# Patient Record
Sex: Female | Born: 1995 | Race: Black or African American | Hispanic: No | Marital: Single | Smoking: Never smoker
Health system: Southern US, Community
[De-identification: ages and names within clinical notes are randomized; demographics above are authoritative.]

## PROBLEM LIST (undated history)

## (undated) ENCOUNTER — Emergency Department (HOSPITAL_COMMUNITY): Payer: BC Managed Care – PPO

## (undated) DIAGNOSIS — D649 Anemia, unspecified: Secondary | ICD-10-CM

## (undated) HISTORY — PX: NO PAST SURGERIES: SHX2092

---
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
## 2014-03-10 ENCOUNTER — Encounter (HOSPITAL_COMMUNITY): Payer: Self-pay | Admitting: Emergency Medicine

## 2014-03-10 DIAGNOSIS — D649 Anemia, unspecified: Secondary | ICD-10-CM | POA: Insufficient documentation

## 2014-03-10 DIAGNOSIS — R102 Pelvic and perineal pain: Secondary | ICD-10-CM | POA: Insufficient documentation

## 2014-03-10 DIAGNOSIS — Z3202 Encounter for pregnancy test, result negative: Secondary | ICD-10-CM | POA: Insufficient documentation

## 2014-03-10 DIAGNOSIS — Z79899 Other long term (current) drug therapy: Secondary | ICD-10-CM | POA: Insufficient documentation

## 2014-03-10 LAB — CBC WITH DIFFERENTIAL/PLATELET
BASOS PCT: 0 % (ref 0–1)
Basophils Absolute: 0 10*3/uL (ref 0.0–0.1)
EOS ABS: 0.1 10*3/uL (ref 0.0–0.7)
EOS PCT: 2 % (ref 0–5)
HEMATOCRIT: 34.6 % — AB (ref 36.0–46.0)
HEMOGLOBIN: 11.4 g/dL — AB (ref 12.0–15.0)
Lymphocytes Relative: 19 % (ref 12–46)
Lymphs Abs: 1.5 10*3/uL (ref 0.7–4.0)
MCH: 26.8 pg (ref 26.0–34.0)
MCHC: 32.9 g/dL (ref 30.0–36.0)
MCV: 81.4 fL (ref 78.0–100.0)
MONO ABS: 0.4 10*3/uL (ref 0.1–1.0)
MONOS PCT: 5 % (ref 3–12)
NEUTROS ABS: 5.9 10*3/uL (ref 1.7–7.7)
Neutrophils Relative %: 74 % (ref 43–77)
Platelets: 328 10*3/uL (ref 150–400)
RBC: 4.25 MIL/uL (ref 3.87–5.11)
RDW: 18.7 % — ABNORMAL HIGH (ref 11.5–15.5)
WBC: 8 10*3/uL (ref 4.0–10.5)

## 2014-03-10 LAB — COMPREHENSIVE METABOLIC PANEL
ALBUMIN: 3.9 g/dL (ref 3.5–5.2)
ALT: 10 U/L (ref 0–35)
ANION GAP: 15 (ref 5–15)
AST: 16 U/L (ref 0–37)
Alkaline Phosphatase: 84 U/L (ref 39–117)
BILIRUBIN TOTAL: 0.5 mg/dL (ref 0.3–1.2)
BUN: 9 mg/dL (ref 6–23)
CHLORIDE: 100 meq/L (ref 96–112)
CO2: 23 mEq/L (ref 19–32)
CREATININE: 0.76 mg/dL (ref 0.50–1.10)
Calcium: 9.2 mg/dL (ref 8.4–10.5)
GFR calc Af Amer: >90 mL/min (ref >90)
GFR calc non Af Amer: >90 mL/min (ref >90)
Glucose, Bld: 126 mg/dL — ABNORMAL HIGH (ref 70–99)
Potassium: 3.6 mEq/L — ABNORMAL LOW (ref 3.7–5.3)
Sodium: 138 mEq/L (ref 137–147)
Total Protein: 7.8 g/dL (ref 6.0–8.3)

## 2014-03-10 NOTE — ED Notes (Signed)
Pt. reports LLQ pain with mild nausea onset 5 days ago , denies urinary discomfort , no emesis or diarrhea . Respirations unlabored / denies fever or chills. Pt. is on her 5th day of menstrual cycle.

## 2014-03-11 ENCOUNTER — Emergency Department (HOSPITAL_COMMUNITY)
Admission: EM | Admit: 2014-03-11 | Discharge: 2014-03-11 | Disposition: A | Discharge status: Home / Self Care | Payer: Self-pay | Attending: Emergency Medicine | Admitting: Emergency Medicine

## 2014-03-11 ENCOUNTER — Emergency Department (HOSPITAL_COMMUNITY): Payer: Self-pay

## 2014-03-11 DIAGNOSIS — R102 Pelvic and perineal pain: Secondary | ICD-10-CM

## 2014-03-11 DIAGNOSIS — R1032 Left lower quadrant pain: Secondary | ICD-10-CM

## 2014-03-11 LAB — URINALYSIS, ROUTINE W REFLEX MICROSCOPIC
BILIRUBIN URINE: NEGATIVE
Glucose, UA: NEGATIVE mg/dL
KETONES UR: NEGATIVE mg/dL
Leukocytes, UA: NEGATIVE
NITRITE: NEGATIVE
Protein, ur: NEGATIVE mg/dL
SPECIFIC GRAVITY, URINE: 1.019 (ref 1.005–1.030)
UROBILINOGEN UA: 0.2 mg/dL (ref 0.0–1.0)
pH: 7.5 (ref 5.0–8.0)

## 2014-03-11 LAB — WET PREP, GENITAL
Trich, Wet Prep: NONE SEEN
Yeast Wet Prep HPF POC: NONE SEEN

## 2014-03-11 LAB — HIV ANTIBODY (ROUTINE TESTING W REFLEX): HIV 1&2 Ab, 4th Generation: NONREACTIVE

## 2014-03-11 LAB — URINE MICROSCOPIC-ADD ON

## 2014-03-11 LAB — RPR

## 2014-03-11 LAB — PREGNANCY, URINE: PREG TEST UR: NEGATIVE

## 2014-03-11 MED ORDER — NAPROXEN SODIUM 220 MG PO TABS: 220.0000 mg | ORAL_TABLET | Freq: Two times a day (BID) | ORAL | Status: AC

## 2014-03-11 MED ORDER — OXYCODONE-ACETAMINOPHEN 5-325 MG PO TABS
2.0000 | ORAL_TABLET | Freq: Once | ORAL | Status: AC
  Administered 2014-03-11: 2 via ORAL
  Filled 2014-03-11: qty 2

## 2014-03-11 MED ORDER — OXYCODONE-ACETAMINOPHEN 5-325 MG PO TABS
1.0000 | ORAL_TABLET | Freq: Four times a day (QID) | ORAL | Status: AC | PRN
Start: 2014-03-11 — End: ?

## 2014-03-11 NOTE — ED Notes (Signed)
Pelvic cart set up at bedside  

## 2014-03-11 NOTE — ED Notes (Signed)
Returned from [facility]

## 2014-03-11 NOTE — ED Provider Notes (Signed)
CSN: [id number]     Arrival date & time 03/10/14  2107 History   First MD Initiated Contact with Patient 03/11/14 0049     Chief Complaint  Patient presents with  . Abdominal Pain     (Consider location/radiation/quality/duration/timing/severity/associated sxs/prior Treatment) HPI 18 yo female presents to the ER from home with complaint of LLQ pain ongoing for the last 5 days, coinciding with onset of her period.  Pt has h/o painful menses, but reports not usually this bad.  Pt had depo in the past which did not help with symptoms, and has been advised to start OCP.  She has not f/u with gyn as suggested.  No fevers, chills, n/v/d.  No vaginal discharge, no urinary symptoms.  Pt also c/o shortness of breath upon waking this morning.  No cough, wheeze or URI type symptoms.  Pt is nonsmoker.  No h/o asthma.   Past Medical History  Diagnosis Date  . Anemia   . Anemia    Past Surgical History  Procedure Laterality Date  . No past surgeries     Family History  Problem Relation Age of Onset  . Arthritis Maternal Grandmother   . Cancer Maternal Grandmother   . Cancer Maternal Grandfather     lung, spine, and breast   History  Substance Use Topics  . Smoking status: Never Smoker   . Smokeless tobacco: Not on file  . Alcohol Use: No   OB History   Grav Para Term Preterm Abortions TAB SAB Ect Mult Living   0              Review of Systems  All other systems reviewed and are negative.     Allergies  Shrimp  Home Medications   Prior to Admission medications   Medication Sig Start Date End Date Taking? Authorizing Provider  albuterol (PROVENTIL HFA;VENTOLIN HFA) 108 (90 BASE) MCG/ACT inhaler Inhale 1-2 puffs into the lungs every 6 (six) hours as needed for wheezing. 01/03/13  Yes Heather Laisure, PA-C  IRON PO Take by mouth.   Yes Historical Provider, MD  naproxen sodium (ANAPROX) 220 MG tablet Take 220 mg by mouth 2 (two) times daily with a meal.   Yes Historical Provider, MD   ferrous sulfate 325 (65 FE) MG tablet Take 1 tablet (325 mg total) by mouth daily. 08/12/11 08/11/12  Cyndra Numbers, MD   BP 117/63  Pulse 82  Temp(Src) 99.1 F (37.3 C) (Oral)  Resp 23  Ht 5\' 3"  (1.6 m)  Wt 142 lb (64.411 kg)  BMI 25.16 kg/m2  SpO2 94%  LMP 03/06/2014 Physical Exam  Nursing note and vitals reviewed. Constitutional: She is oriented to person, place, and time. She appears well-developed and well-nourished.  HENT:  Head: Normocephalic and atraumatic.  Nose: Nose normal.  Mouth/Throat: Oropharynx is clear and moist.  Eyes: Conjunctivae and EOM are normal. Pupils are equal, round, and reactive to light.  Neck: Normal range of motion. Neck supple. No JVD present. No tracheal deviation present. No thyromegaly present.  Cardiovascular: Normal rate, regular rhythm, normal heart sounds and intact distal pulses.  Exam reveals no gallop and no friction rub.   No murmur heard. Pulmonary/Chest: Effort normal and breath sounds normal. No stridor. No respiratory distress. She has no wheezes. She has no rales. She exhibits no tenderness.  Abdominal: Soft. Bowel sounds are normal. She exhibits no distension and no mass. There is tenderness (ttp over lower abdomen worse in LLQ). There is no rebound and no  guarding.  Genitourinary:  External genitalia normal Vagina with small amount of bloody discharge Cervix  normal negative for cervical motion tenderness Adnexa palpated, no masses but positive for tenderness on left noted Bladder palpated negative for tenderness Uterus palpated no masses or positive for tenderness    Musculoskeletal: Normal range of motion. She exhibits no edema and no tenderness.  Lymphadenopathy:    She has no cervical adenopathy.  Neurological: She is alert and oriented to person, place, and time. She displays normal reflexes. She exhibits normal muscle tone. Coordination normal.  Skin: Skin is warm and dry. No rash noted. No erythema. No pallor.   Psychiatric: She has a normal mood and affect. Her behavior is normal. Judgment and thought content normal.    ED Course  Procedures (including critical care time) Labs Review Labs Reviewed  WET PREP, GENITAL - Abnormal; Notable for the following:    Clue Cells Wet Prep HPF POC FEW (*)    WBC, Wet Prep HPF POC FEW (*)    All other components within normal limits  CBC WITH DIFFERENTIAL - Abnormal; Notable for the following:    Hemoglobin 11.4 (*)    HCT 34.6 (*)    RDW 18.7 (*)    All other components within normal limits  COMPREHENSIVE METABOLIC PANEL - Abnormal; Notable for the following:    Potassium 3.6 (*)    Glucose, Bld 126 (*)    All other components within normal limits  URINALYSIS, ROUTINE W REFLEX MICROSCOPIC - Abnormal; Notable for the following:    Hgb urine dipstick MODERATE (*)    All other components within normal limits  GC/CHLAMYDIA PROBE AMP  PREGNANCY, URINE  URINE MICROSCOPIC-ADD ON  RPR  HIV ANTIBODY (ROUTINE TESTING)    Imaging Review [place] Transvaginal Non-ob  03/11/2014   CLINICAL DATA:  Initial evaluation for acute left lower quadrant pain  EXAM: ULTRASOUND PELVIS TRANSVAGINAL  TECHNIQUE: Transvaginal ultrasound examination of the pelvis was performed including evaluation of the uterus, ovaries, adnexal regions, and pelvic cul-de-sac.  COMPARISON:  Prior ultrasound from 08/14/2011.  FINDINGS: Uterus  Measurements: 7.1 x 4.2 x 4.8 cm. No fibroids or other mass visualized.  Endometrium  Thickness: 4.5 mm.  No focal abnormality visualized.  Right ovary  Measurements: 4.1 x 3.0 x 2.9 cm. Normal appearance. A 1.2 x 1.4 x 1.3 cm cyst within the right ovary noted with internal debris. No internal vascularity. Likely physiologic cyst.  Left ovary  Measurements: 3.6 x 1.9 x 2.2 cm. Normal appearance/no adnexal mass.  Other findings: Trace free fluid present within the pelvis. Likely physiologic.  IMPRESSION: 1. No sonographic evidence of acute abnormality within the  pelvis. 2. 1.2 x 1.4 x 1.3 cm right ovarian cyst with internal debris, likely a physiologic cyst. 3. Normal left ovary. 4. Trace free fluid within the pelvis, likely physiologic.   Electronically Signed   By: Rise MuBenjamin  McClintock M.D.   On: 03/11/2014 06:25   [place] Pelvis Complete  03/11/2014   CLINICAL DATA:  Initial evaluation for acute left lower quadrant pain  EXAM: ULTRASOUND PELVIS TRANSVAGINAL  TECHNIQUE: Transvaginal ultrasound examination of the pelvis was performed including evaluation of the uterus, ovaries, adnexal regions, and pelvic cul-de-sac.  COMPARISON:  Prior ultrasound from 08/14/2011.  FINDINGS: Uterus  Measurements: 7.1 x 4.2 x 4.8 cm. No fibroids or other mass visualized.  Endometrium  Thickness: 4.5 mm.  No focal abnormality visualized.  Right ovary  Measurements: 4.1 x 3.0 x 2.9 cm. Normal appearance. A 1.2 x  1.4 x 1.3 cm cyst within the right ovary noted with internal debris. No internal vascularity. Likely physiologic cyst.  Left ovary  Measurements: 3.6 x 1.9 x 2.2 cm. Normal appearance/no adnexal mass.  Other findings: Trace free fluid present within the pelvis. Likely physiologic.  IMPRESSION: 1. No sonographic evidence of acute abnormality within the pelvis. 2. 1.2 x 1.4 x 1.3 cm right ovarian cyst with internal debris, likely a physiologic cyst. 3. Normal left ovary. 4. Trace free fluid within the pelvis, likely physiologic.   Electronically Signed   By: Rise MuBenjamin  McClintock M.D.   On: 03/11/2014 06:25     EKG Interpretation None      MDM   Final diagnoses:  LLQ pain    18 yo female with acute on chronic pelvic pain.  Pain with each cycle, has irregular cycles.  Pain currently in LLQ, worse than usual.  Also with some sob, no cough, or wheezing.  No lung disease.  No urinary symptoms, no n/v/d,no f/c.  U/s unremarkable, pelvic unremarkable.  Will have her f/u with gyn.    Olivia Mackielga M Legaci Tarman, MD 03/12/14 [phone]

## 2014-03-11 NOTE — Discharge Instructions (Signed)
Your workup has not shown a specific cause for your symptoms.  Your ultrasound showed a small cyst on your right ovary that should not be causing your symptoms.  Please take medications as prescribed.  Follow up with gynecology for further workup and treatment of your ongoing pelvic pain.   Pelvic Pain Female pelvic pain can be caused by many different things and start from a variety of places. Pelvic pain refers to pain that is located in the lower half of the abdomen and between your hips. The pain may occur over a short period of time (acute) or may be reoccurring (chronic). The cause of pelvic pain may be related to disorders affecting the female reproductive organs (gynecologic), but it may also be related to the bladder, kidney stones, an intestinal complication, or muscle or skeletal problems. Getting help right away for pelvic pain is important, especially if there has been severe, sharp, or a sudden onset of unusual pain. It is also important to get help right away because some types of pelvic pain can be life threatening.  CAUSES  Below are only some of the causes of pelvic pain. The causes of pelvic pain can be in one of several categories.   Gynecologic.  Pelvic inflammatory disease.  Sexually transmitted infection.  Ovarian cyst or a twisted ovarian ligament (ovarian torsion).  Uterine lining that grows outside the uterus (endometriosis).  Fibroids, cysts, or tumors.  Ovulation.  Pregnancy.  Pregnancy that occurs outside the uterus (ectopic pregnancy).  Miscarriage.  Labor.  Abruption of the placenta or ruptured uterus.  Infection.  Uterine infection (endometritis).  Bladder infection.  Diverticulitis.  Miscarriage related to a uterine infection (septic abortion).  Bladder.  Inflammation of the bladder (cystitis).  Kidney stone(s).  Gastrointestinal.  Constipation.  Diverticulitis.  Neurologic.  Trauma.  Feeling pelvic pain because of mental or  emotional causes (psychosomatic).  Cancers of the bowel or pelvis. EVALUATION  Your caregiver will want to take a careful history of your concerns. This includes recent changes in your health, a careful gynecologic history of your periods (menses), and a sexual history. Obtaining your family history and medical history is also important. Your caregiver may suggest a pelvic exam. A pelvic exam will help identify the location and severity of the pain. It also helps in the evaluation of which organ system may be involved. In order to identify the cause of the pelvic pain and be properly treated, your caregiver may order tests. These tests may include:   A pregnancy test.  Pelvic ultrasonography.  An X-ray exam of the abdomen.  A urinalysis or evaluation of vaginal discharge.  Blood tests. HOME CARE INSTRUCTIONS   Only take over-the-counter or prescription medicines for pain, discomfort, or fever as directed by your caregiver.   Rest as directed by your caregiver.   Eat a balanced diet.   Drink enough fluids to make your urine clear or pale yellow, or as directed.   Avoid sexual intercourse if it causes pain.   Apply warm or cold compresses to the lower abdomen depending on which one helps the pain.   Avoid stressful situations.   Keep a journal of your pelvic pain. Write down when it started, where the pain is located, and if there are things that seem to be associated with the pain, such as food or your menstrual cycle.  Follow up with your caregiver as directed.  SEEK MEDICAL CARE IF:  Your medicine does not help your pain.  You have abnormal  vaginal discharge. SEEK IMMEDIATE MEDICAL CARE IF:   You have heavy bleeding from the vagina.   Your pelvic pain increases.   You feel light-headed or faint.   You have chills.   You have pain with urination or blood in your urine.   You have uncontrolled diarrhea or vomiting.   You have a fever or persistent  symptoms for more than 3 days.  You have a fever and your symptoms suddenly get worse.   You are being physically or sexually abused.  MAKE SURE YOU:  Understand these instructions.  Will watch your condition.  Will get help if you are not doing well or get worse. Document Released: 04/15/2004 Document Revised: 10/03/2013 Document Reviewed: 09/08/2011 [facility] Patient Information 2015 [place], [place]. This information is not intended to replace advice given to you by your health care provider. Make sure you discuss any questions you have with your health care provider.

## 2014-03-11 NOTE — ED Notes (Signed)
Pt transported to US by Silvio PateShelia, EMT

## 2014-03-13 LAB — GC/CHLAMYDIA PROBE AMP
CT PROBE, AMP APTIMA: NEGATIVE
GC PROBE AMP APTIMA: NEGATIVE

## 2014-08-08 ENCOUNTER — Emergency Department (HOSPITAL_COMMUNITY)
Admission: EM | Admit: 2014-08-08 | Discharge: 2014-08-08 | Disposition: A | Discharge status: Home / Self Care | Payer: Self-pay | Attending: Emergency Medicine | Admitting: Emergency Medicine

## 2014-08-08 ENCOUNTER — Encounter (HOSPITAL_COMMUNITY): Payer: Self-pay | Admitting: Emergency Medicine

## 2014-08-08 ENCOUNTER — Emergency Department (HOSPITAL_COMMUNITY)
Admission: EM | Admit: 2014-08-08 | Discharge: 2014-08-08 | Discharge status: Absent without leave | Payer: Self-pay | Source: Home / Self Care

## 2014-08-08 DIAGNOSIS — Y929 Unspecified place or not applicable: Secondary | ICD-10-CM | POA: Insufficient documentation

## 2014-08-08 DIAGNOSIS — S61451A Open bite of right hand, initial encounter: Secondary | ICD-10-CM | POA: Insufficient documentation

## 2014-08-08 DIAGNOSIS — W540XXA Bitten by dog, initial encounter: Secondary | ICD-10-CM | POA: Insufficient documentation

## 2014-08-08 DIAGNOSIS — Z79899 Other long term (current) drug therapy: Secondary | ICD-10-CM | POA: Insufficient documentation

## 2014-08-08 DIAGNOSIS — D649 Anemia, unspecified: Secondary | ICD-10-CM | POA: Insufficient documentation

## 2014-08-08 DIAGNOSIS — Y9389 Activity, other specified: Secondary | ICD-10-CM | POA: Insufficient documentation

## 2014-08-08 DIAGNOSIS — Z23 Encounter for immunization: Secondary | ICD-10-CM | POA: Insufficient documentation

## 2014-08-08 DIAGNOSIS — Y998 Other external cause status: Secondary | ICD-10-CM | POA: Insufficient documentation

## 2014-08-08 MED ORDER — TETANUS-DIPHTH-ACELL PERTUSSIS 5-2.5-18.5 LF-MCG/0.5 IM SUSP
0.5000 mL | Freq: Once | INTRAMUSCULAR | Status: AC
  Administered 2014-08-08: 0.5 mL via INTRAMUSCULAR
  Filled 2014-08-08: qty 0.5

## 2014-08-08 MED ORDER — AMOXICILLIN-POT CLAVULANATE 875-125 MG PO TABS: 1.0000 | ORAL_TABLET | Freq: Two times a day (BID) | ORAL | Status: AC

## 2014-08-08 NOTE — ED Notes (Signed)
Pt st's she was bitten by a friends dog 3 days ago.  Pt has dog bite to right hand with swelling and redness

## 2014-08-08 NOTE — Discharge Instructions (Signed)

## 2014-08-08 NOTE — ED Notes (Signed)
Pt. Reports owner said the dog was up-to-date on vaccinations.

## 2014-08-08 NOTE — ED Provider Notes (Signed)
CSN: [id number]     Arrival date & time 08/08/14  1725 History  This chart was scribed for non-physician practitioner, Lonia Skinner. Keenan Bachelor, PA-C working with Arby Barrette, MD by Gwenyth Ober, ED scribe. This patient was seen in room TR06C/TR06C and the patient's care was started at 6:31 PM   Chief Complaint  Patient presents with  . Animal Bite   The history is provided by the patient. No language interpreter was used.   HPI Comments: Elaine Santiago is a 19 y.o. female who presents to the Emergency Department complaining of constant, moderate swelling and 4 puncture wounds to her right hand after she was bit by a dog 3 days ago. She states that she was playing with the dog when he snapped at her hand. Pt knows the dog and the owner. The dog is UTD on vaccinations.   Past Medical History  Diagnosis Date  . Anemia   . Anemia    Past Surgical History  Procedure Laterality Date  . No past surgeries     Family History  Problem Relation Age of Onset  . Arthritis Maternal Grandmother   . Cancer Maternal Grandmother   . Cancer Maternal Grandfather     lung, spine, and breast   History  Substance Use Topics  . Smoking status: Never Smoker   . Smokeless tobacco: Not on file  . Alcohol Use: No   OB History    Gravida Para Term Preterm AB TAB SAB Ectopic Multiple Living   0              Review of Systems  Musculoskeletal: Positive for joint swelling and arthralgias.  Skin: Positive for wound.  All other systems reviewed and are negative.     Allergies  Shrimp  Home Medications   Prior to Admission medications   Medication Sig Start Date End Date Taking? Authorizing Provider  albuterol (PROVENTIL HFA;VENTOLIN HFA) 108 (90 BASE) MCG/ACT inhaler Inhale 1-2 puffs into the lungs every 6 (six) hours as needed for wheezing. 01/03/13  Yes Heather Laisure, PA-C  ibuprofen (ADVIL,MOTRIN) 200 MG tablet Take 200 mg by mouth every 6 (six) hours as needed for moderate pain.   Yes  Historical Provider, MD  IRON PO Take by mouth.   Yes Historical Provider, MD  ferrous sulfate 325 (65 FE) MG tablet Take 1 tablet (325 mg total) by mouth daily. 08/12/11 08/11/12  Cyndra Numbers, MD  naproxen sodium (ANAPROX) 220 MG tablet Take 1 tablet (220 mg total) by mouth 2 (two) times daily with a meal. Patient not taking: Reported on 08/08/2014 03/11/14   Marisa Severin, MD  oxyCODONE-acetaminophen (PERCOCET/ROXICET) 5-325 MG per tablet Take 1-2 tablets by mouth every 6 (six) hours as needed for severe pain. Patient not taking: Reported on 08/08/2014 03/11/14   Marisa Severin, MD   BP 114/71 mmHg  Pulse 64  Temp(Src) 98.3 F (36.8 C) (Oral)  Resp 18  SpO2 100%  LMP 07/26/2014 Physical Exam  Constitutional: She appears well-developed and well-nourished. No distress.  HENT:  Head: Normocephalic and atraumatic.  Eyes: Conjunctivae and EOM are normal.  Neck: Neck supple. No tracheal deviation present.  Cardiovascular: Normal rate.   Pulmonary/Chest: Effort normal. No respiratory distress.  Skin: Skin is warm and dry. There is erythema.  Right hand 2 puncture wounds on dorsal aspect, 2 puncture wounds palmar aspect, slight erythema; no streaking; no obvious abscess  Psychiatric: She has a normal mood and affect. Her behavior is normal.  Nursing note and  vitals reviewed.   ED Course  Procedures   DIAGNOSTIC STUDIES: Oxygen Saturation is 100% on RA, normal by my interpretation.    COORDINATION OF CARE: 6:33 PM Discussed treatment plan with pt at bedside and pt agreed to plan.   Labs Review Labs Reviewed - No data to display  Imaging Review No results found.   EKG Interpretation None      MDM  rx for augmentin.  Doubt need for rabies.   Return if any problems.   Final diagnoses:  Animal bite of right hand, initial encounter     I personally performed the services in this documentation, which was scribed in my presence.  The recorded information has been reviewed and  considered.   Barnet PallKaren SofiaPAC.   Lonia SkinnerLeslie K ElginSofia, PA-C 08/08/14 2311  Arby BarretteMarcy Pfeiffer, MD 08/11/14 [phone]

## 2014-12-17 IMAGING — CR DG CHEST 2V
2 series · 2 of 2 positions shown · non-contrast
Comparison: None

CLINICAL DATA: Shortness of breath.

CHEST - 2 VIEW

[w chest pa]
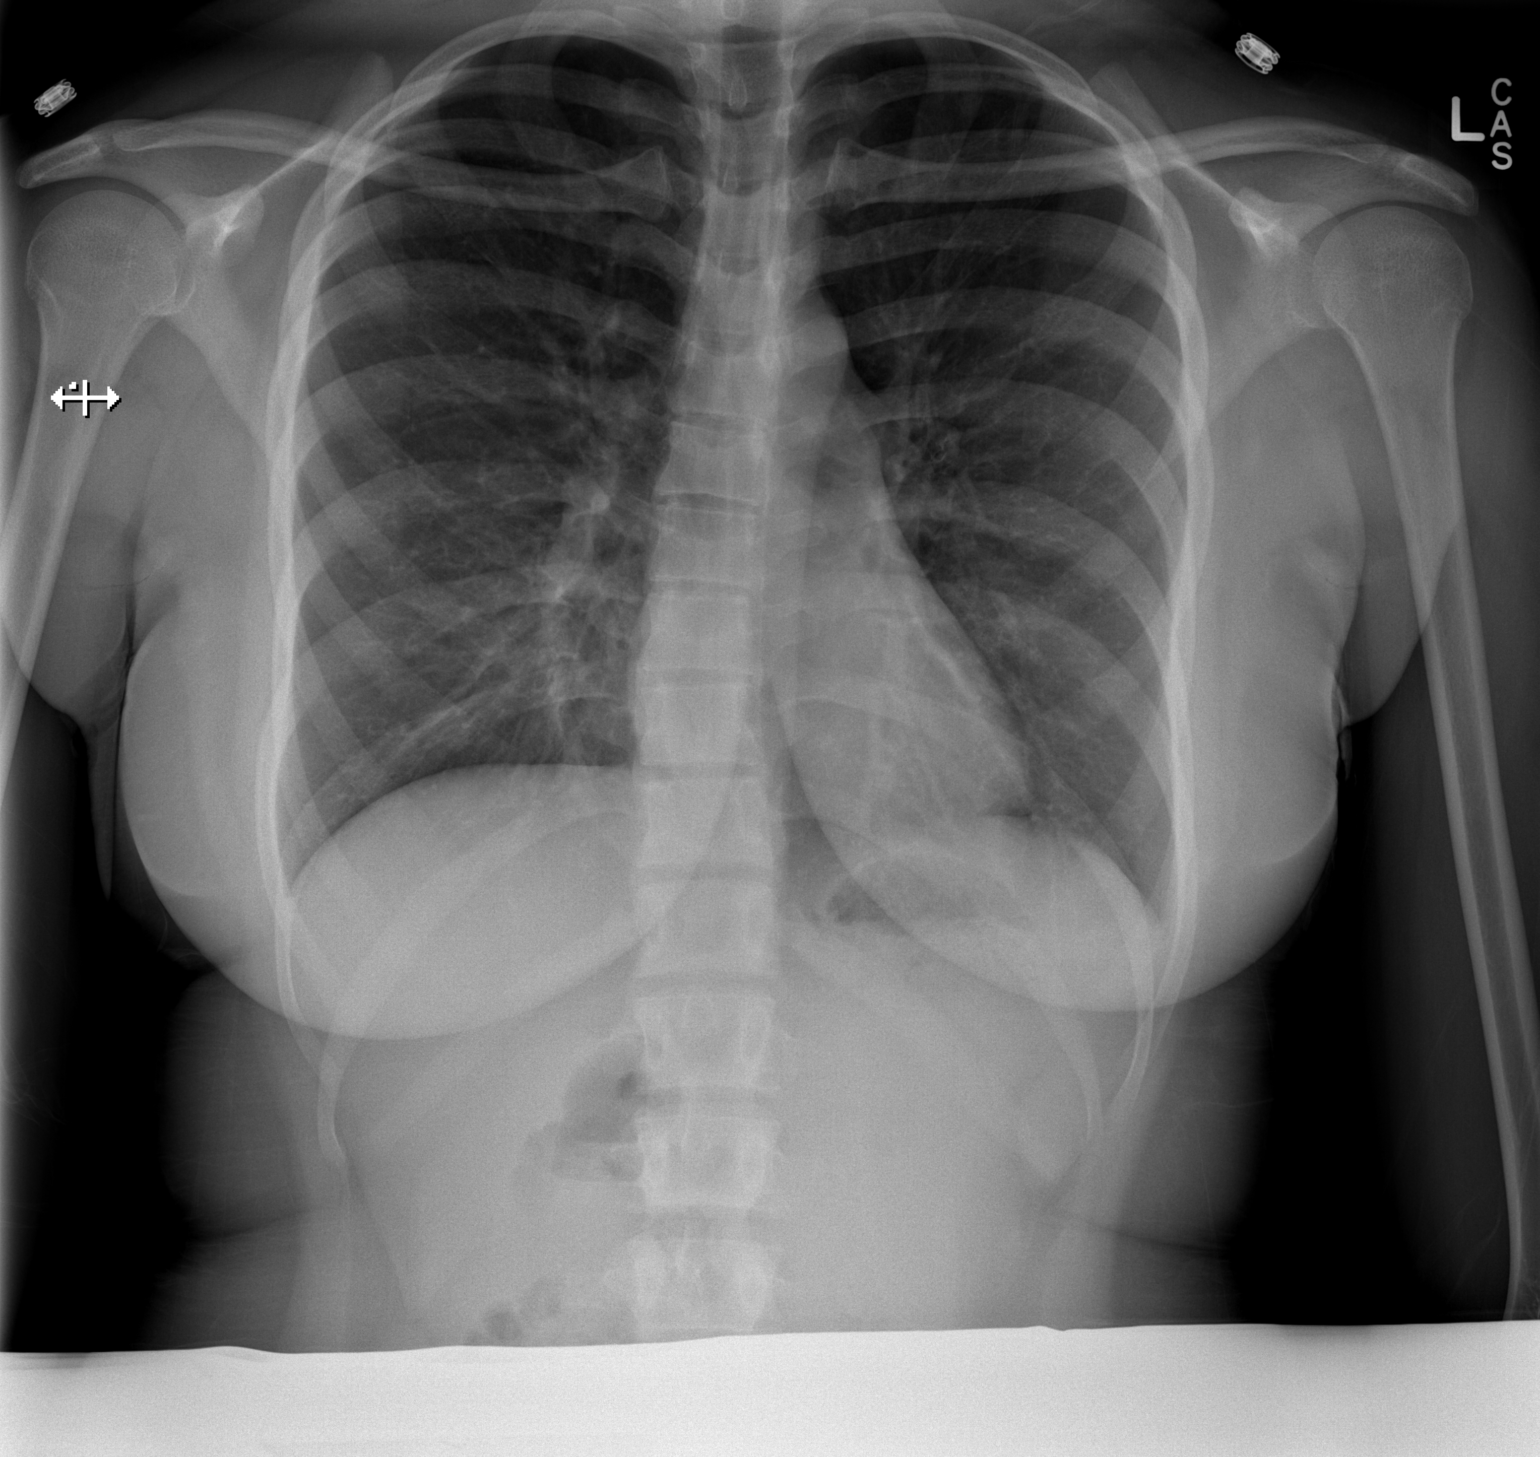

[w chest lat]
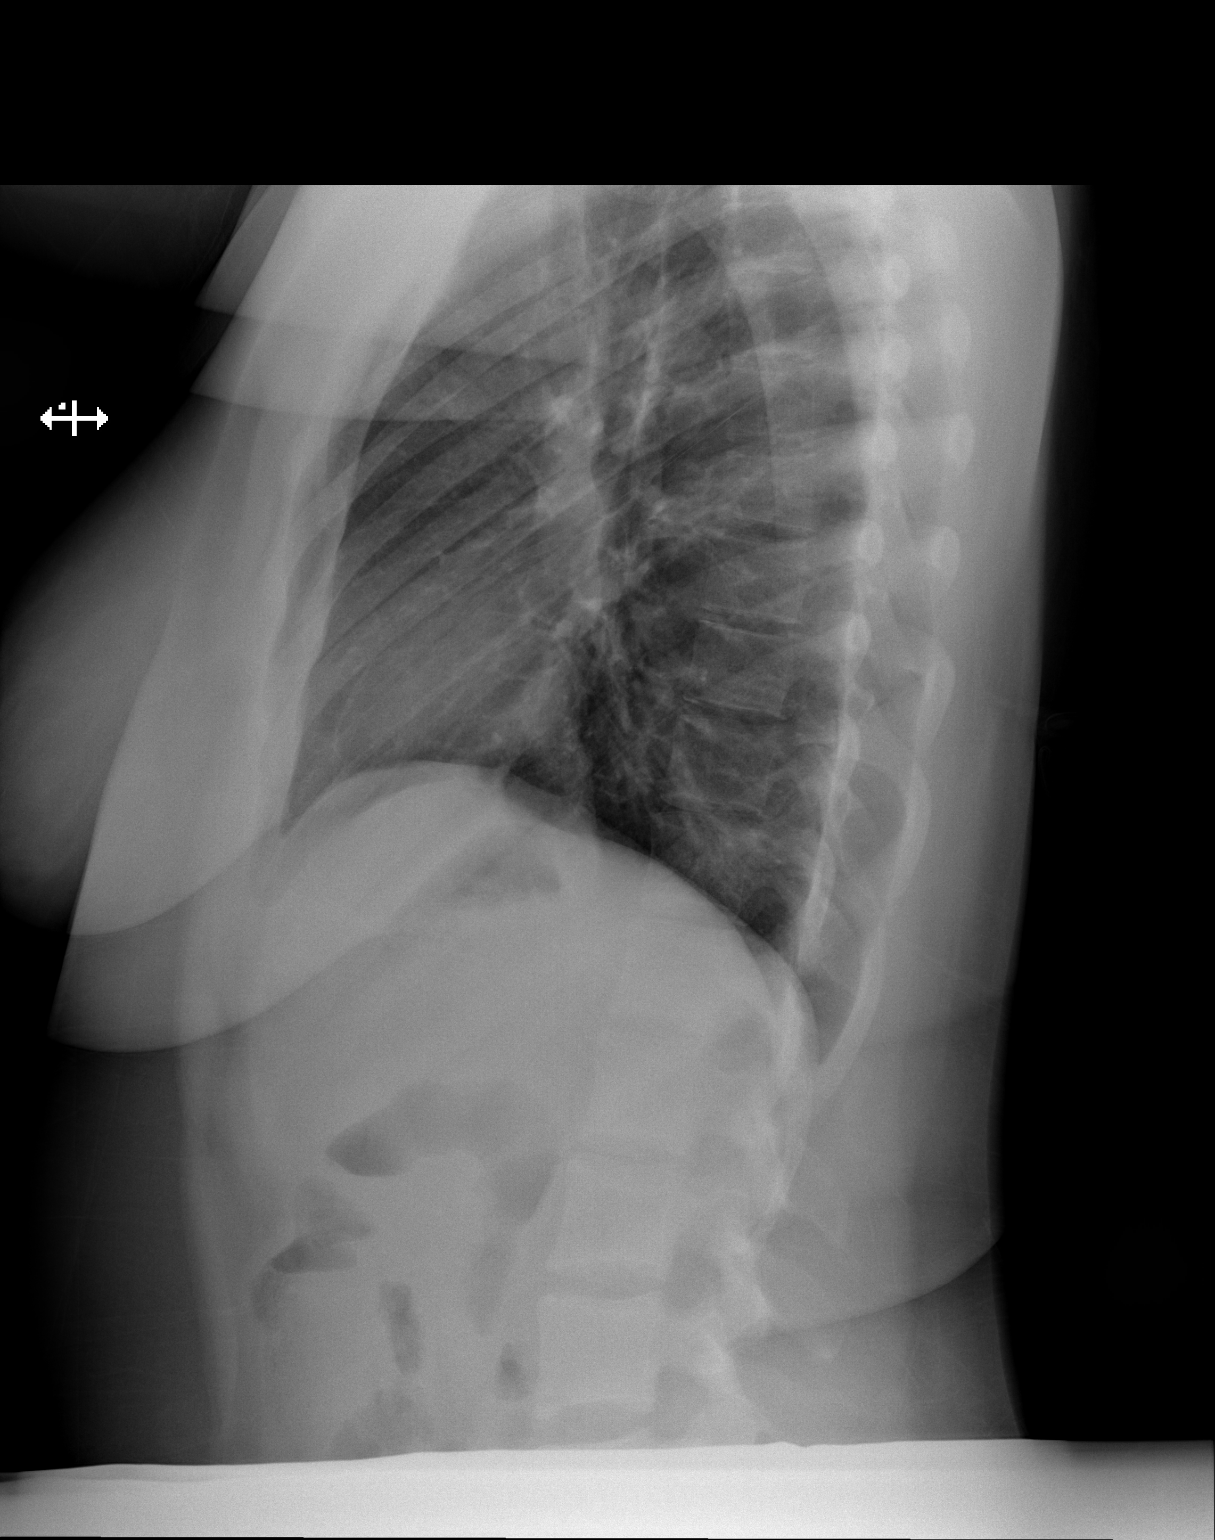

[2 of 2 positions shown; findings below may reference images not displayed]

FINDINGS: Lateral view degraded by patient arm position.

Lateral view also mildly oblique.  Minimal S-shaped thoracolumbar
spine curvature. Midline trachea.  Normal heart size and
mediastinal contours. No pleural effusion or pneumothorax. Clear
lungs.
IMPRESSION: No acute cardiopulmonary disease.

## 2016-02-22 IMAGING — US US TRANSVAGINAL NON-OB
1 series · 14 of 25 positions shown · non-contrast
Comparison: Prior ultrasound from 08/14/2011.

CLINICAL DATA: Initial evaluation for acute left lower quadrant
pain

EXAM:
ULTRASOUND PELVIS TRANSVAGINAL
TECHNIQUE: Transvaginal ultrasound examination of the pelvis was performed
including evaluation of the uterus, ovaries, adnexal regions, and
pelvic cul-de-sac.

[Series 1: us transvaginal non-ob · 0.24mm/px · 14 of 36 slices shown]
[im 1/36]
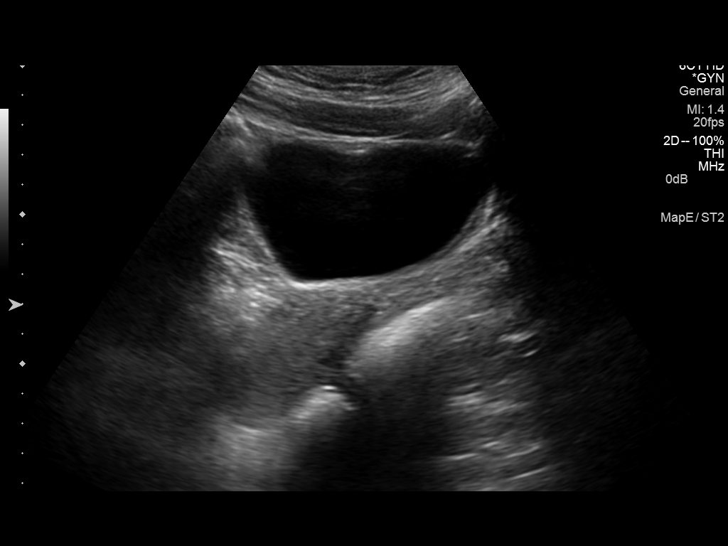
[im 3/36]
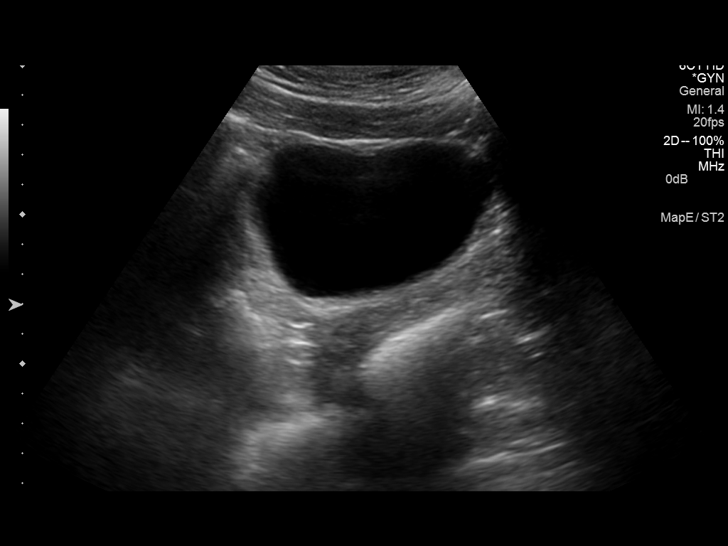
[im 6/36]
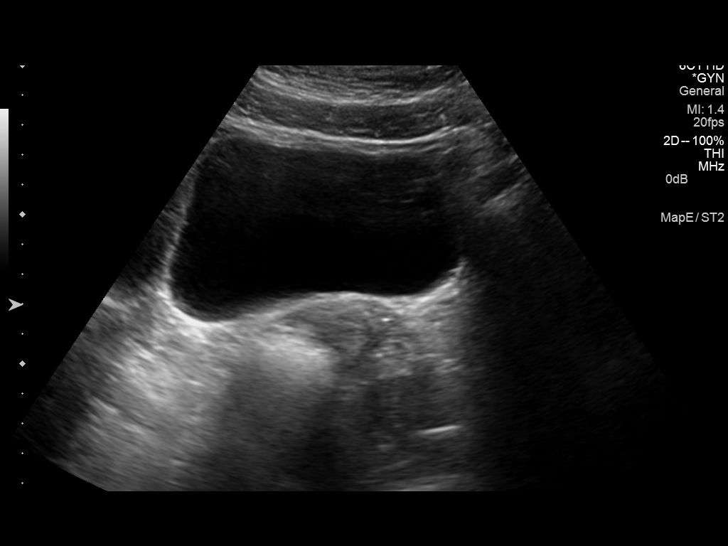
[im 9/36]
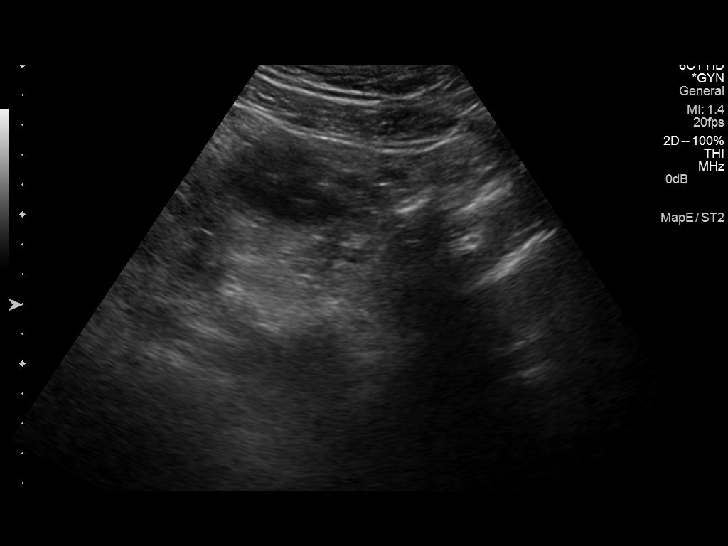
[im 12/36]
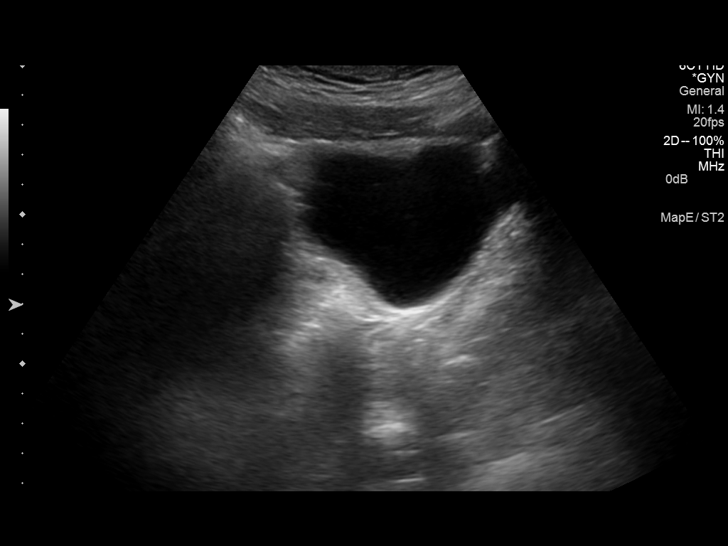
[im 14/36]
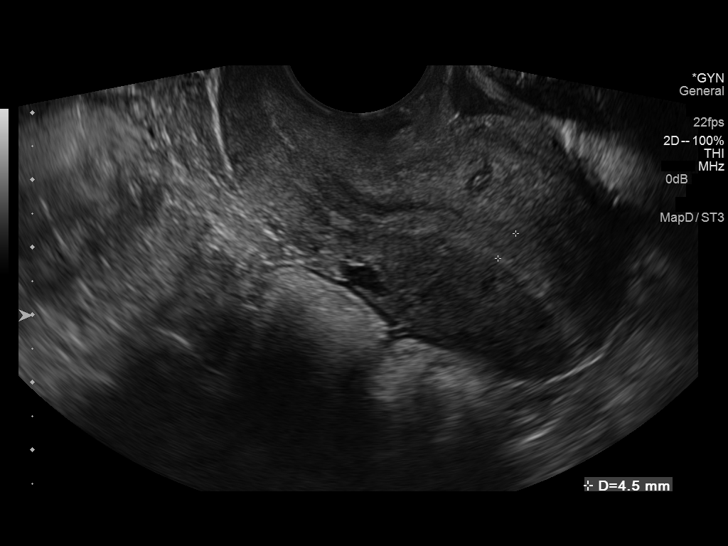
[im 17/36]
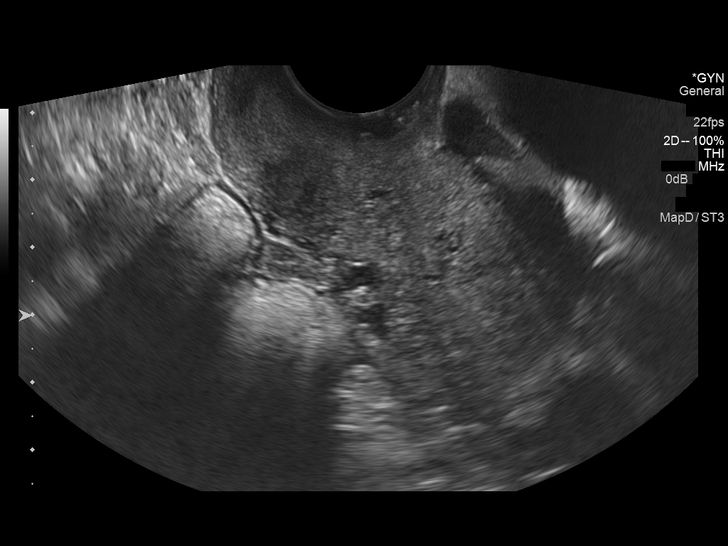
[im 19/36]
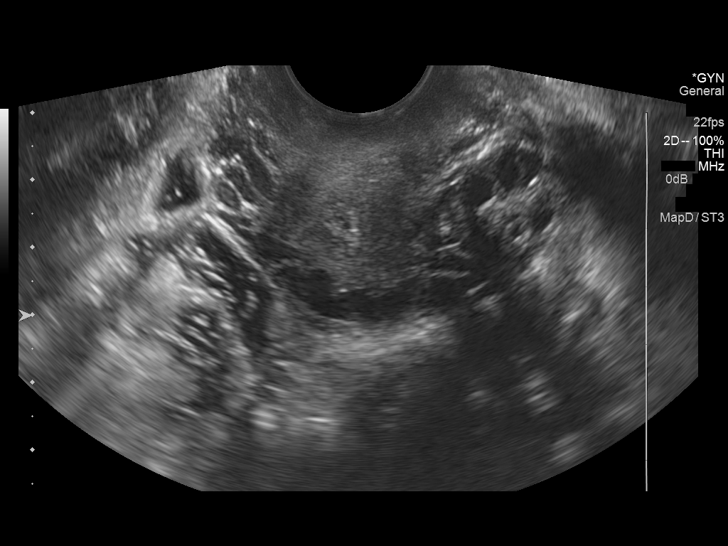
[im 22/36]
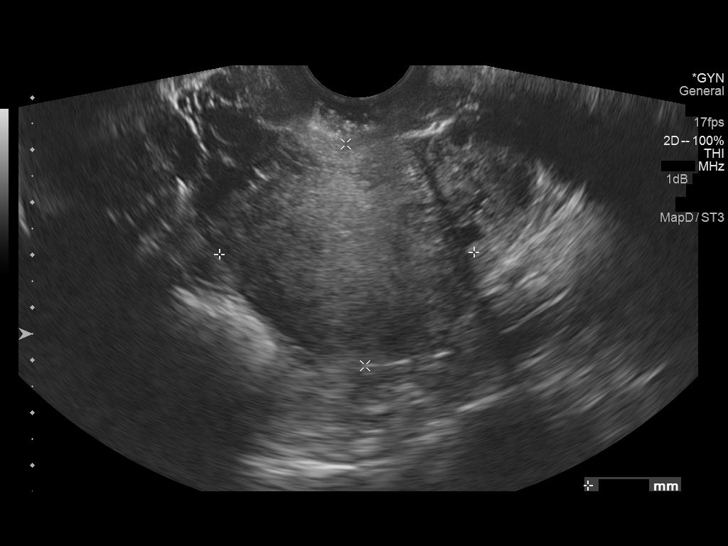
[im 24/36]
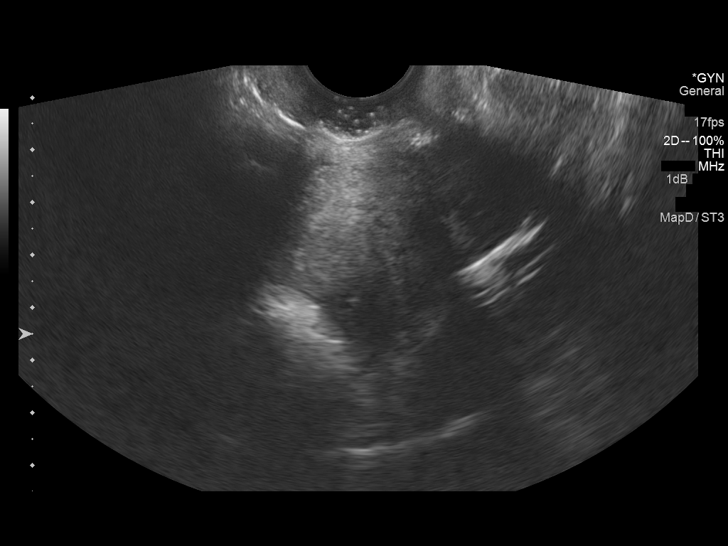
[im 27/36]
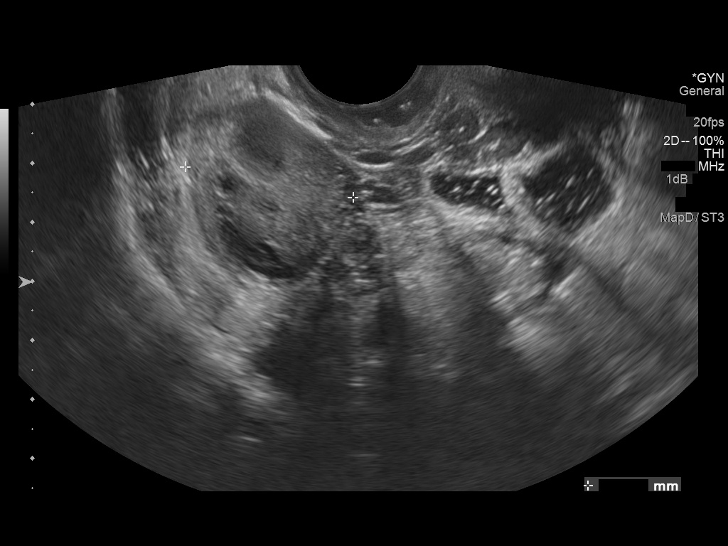
[im 30/36]
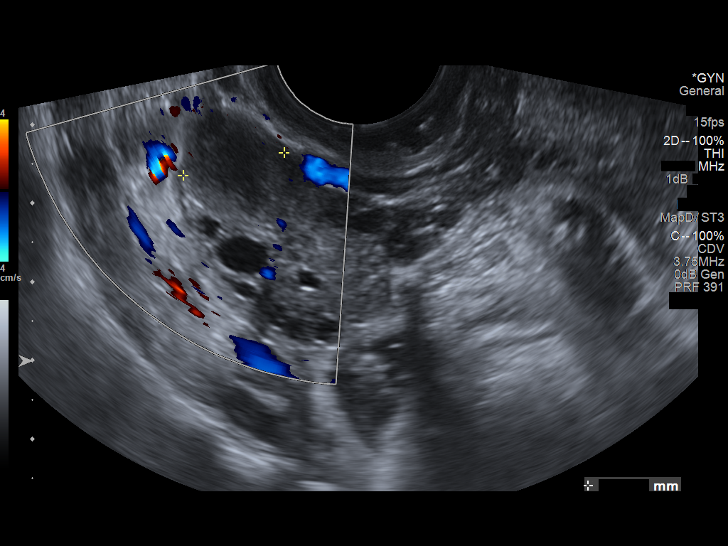
[im 33/36]
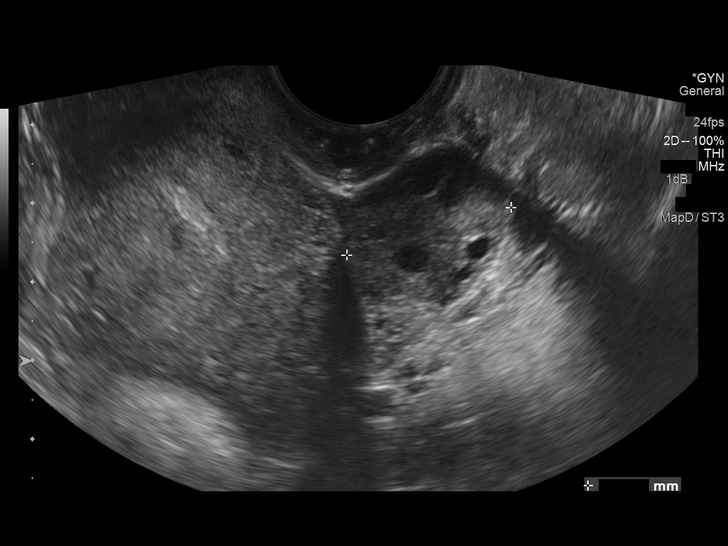
[im 36/36]
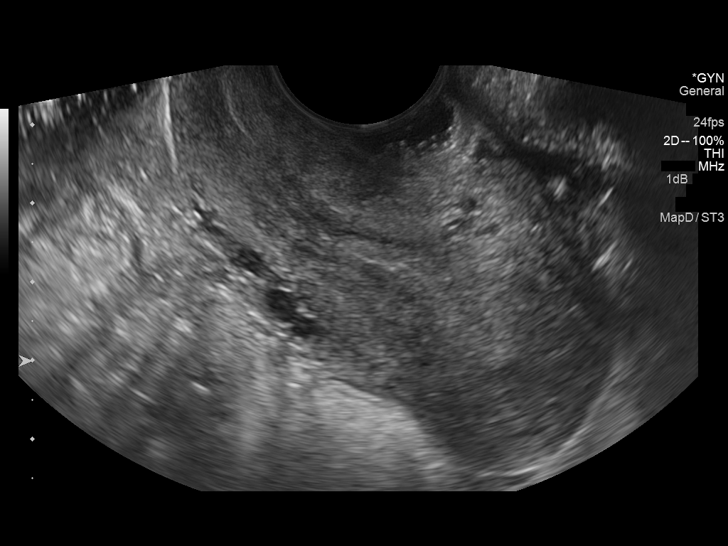

[14 of 25 positions shown; findings below may reference images not displayed]

FINDINGS: Uterus

Measurements: 7.1 x 4.2 x 4.8 cm. No fibroids or other mass
visualized.

Endometrium

Thickness: 4.5 mm.  No focal abnormality visualized.

Right ovary

Measurements: 4.1 x 3.0 x 2.9 cm. Normal appearance. A 1.2 x 1.4 x
1.3 cm cyst within the right ovary noted with internal debris. No
internal vascularity. Likely physiologic cyst.

Left ovary

Measurements: 3.6 x 1.9 x 2.2 cm. Normal appearance/no adnexal mass.

Other findings: Trace free fluid present within the pelvis. Likely
physiologic.
IMPRESSION: 1. No sonographic evidence of acute abnormality within the pelvis.
[DATE] x 1.4 x 1.3 cm right ovarian cyst with internal debris,
likely a physiologic cyst.
3. Normal left ovary.
4. Trace free fluid within the pelvis, likely physiologic.
